# Patient Record
Sex: Male | Born: 2000
Health system: Southern US, Community
[De-identification: ages and names within clinical notes are randomized; demographics above are authoritative.]

## PROBLEM LIST (undated history)

## (undated) ENCOUNTER — Ambulatory Visit

## (undated) DIAGNOSIS — H5500 Unspecified nystagmus: Secondary | ICD-10-CM

## (undated) HISTORY — PX: EYE SURGERY: SHX253

---
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
## 2020-09-06 ENCOUNTER — Other Ambulatory Visit: Payer: Self-pay

## 2020-09-06 ENCOUNTER — Ambulatory Visit
Admission: EM | Admit: 2020-09-06 | Discharge: 2020-09-06 | Disposition: A | Discharge status: Home / Self Care | Payer: Medicaid Other | Attending: Emergency Medicine | Admitting: Emergency Medicine

## 2020-09-06 ENCOUNTER — Ambulatory Visit (INDEPENDENT_AMBULATORY_CARE_PROVIDER_SITE_OTHER): Payer: Medicaid Other

## 2020-09-06 ENCOUNTER — Ambulatory Visit
Admission: RE | Admit: 2020-09-06 | Discharge: 2020-09-06 | Discharge status: Absent without leave | Payer: Self-pay | Source: Ambulatory Visit

## 2020-09-06 DIAGNOSIS — D369 Benign neoplasm, unspecified site: Secondary | ICD-10-CM

## 2020-09-06 DIAGNOSIS — R222 Localized swelling, mass and lump, trunk: Secondary | ICD-10-CM

## 2020-09-06 DIAGNOSIS — L03311 Cellulitis of abdominal wall: Secondary | ICD-10-CM | POA: Diagnosis not present

## 2020-09-06 LAB — CBC WITH DIFFERENTIAL/PLATELET
Abs Immature Granulocytes: 0.01 10*3/uL (ref 0.00–0.07)
Basophils Absolute: 0 10*3/uL (ref 0.0–0.1)
Basophils Relative: 1 %
Eosinophils Absolute: 0.1 10*3/uL (ref 0.0–0.5)
Eosinophils Relative: 1 %
HCT: 42.1 % (ref 39.0–52.0)
Hemoglobin: 14.5 g/dL (ref 13.0–17.0)
Immature Granulocytes: 0 %
Lymphocytes Relative: 41 %
Lymphs Abs: 2.1 10*3/uL (ref 0.7–4.0)
MCH: 31.9 pg (ref 26.0–34.0)
MCHC: 34.4 g/dL (ref 30.0–36.0)
MCV: 92.5 fL (ref 80.0–100.0)
Monocytes Absolute: 0.8 10*3/uL (ref 0.1–1.0)
Monocytes Relative: 16 %
Neutro Abs: 2.1 10*3/uL (ref 1.7–7.7)
Neutrophils Relative %: 41 %
Platelets: 260 10*3/uL (ref 150–400)
RBC: 4.55 MIL/uL (ref 4.22–5.81)
RDW: 12.4 % (ref 11.5–15.5)
WBC: 5.1 10*3/uL (ref 4.0–10.5)
nRBC: 0 % (ref 0.0–0.2)

## 2020-09-06 MED ORDER — MUPIROCIN CALCIUM 2 % NA OINT: TOPICAL_OINTMENT | NASAL | 0 refills | Status: AC

## 2020-09-06 MED ORDER — SULFAMETHOXAZOLE-TRIMETHOPRIM 800-160 MG PO TABS: 1.0000 | ORAL_TABLET | Freq: Two times a day (BID) | ORAL | 0 refills | Status: AC

## 2020-09-06 NOTE — ED Provider Notes (Signed)
[facility]    CSN: [id number] Arrival date & time: 09/06/20  1459      History   Chief Complaint Chief Complaint  Patient presents with   Groin Swelling   Appointment    HPI Adam Kelly is a 19 y.o. male.   19 yo male here for 2 reasons. The first is a knot in his chest at the bottom of hs sternum when his ribs meet his sternum. He reports that he first noticed the knot 3 months ago and it was BB size. It has since grown and is the size of a kidney bean. He denies trauma, the area will be tender from time to time, it ha snot become red, come to a head, or drained anything.  The second issue is swollen lymph nodes in his groin. He reports that he first noticed them on the right side 1.5 months ago. In the last couple of days he has noticed them on the left side. HE denies testicle swelling, pain, pain with urination, urgency, frequency, penile discharge, fever, rash, bruising, night sweats, or weight loss.      Past Medical History:  Diagnosis Date   Nystagmus     There are no problems to display for this patient.   Past Surgical History:  Procedure Laterality Date   EYE SURGERY     EYE SURGERY         Home Medications    Prior to Admission medications   Medication Sig Start Date End Date Taking? Authorizing Provider  mupirocin nasal ointment (BACTROBAN) 2 % Apply in each nostril daily and apply to wound 3 times a day. 09/06/20   Margarette Canada, NP  sulfamethoxazole-trimethoprim (BACTRIM DS) 800-160 MG tablet Take 1 tablet by mouth 2 (two) times daily for 7 days. 09/06/20 09/13/20  Margarette Canada, NP    Family History History reviewed. No pertinent family history.  Social History Social History   Tobacco Use   Smoking status: Current Every Day Smoker    Packs/day: 0.50    Types: Cigarettes   Smokeless tobacco: Never Used  Vaping Use   Vaping Use: Never used  Substance Use Topics   Alcohol use: No   Drug use: Yes    Types:  Marijuana    Comment: last use 2 weeks ago     Allergies   Beef (bovine) protein, Galactose, Other, and Pork allergy   Review of Systems Review of Systems  Constitutional: Positive for diaphoresis. Negative for chills, fatigue, fever and unexpected weight change.       He had sweating last night  HENT: Negative for congestion, ear pain and sore throat.   Respiratory: Negative for apnea, cough, shortness of breath and wheezing.   Cardiovascular: Negative for chest pain.  Gastrointestinal: Negative for abdominal pain, nausea and vomiting.  Genitourinary: Negative for difficulty urinating, discharge, dysuria, frequency, genital sores, hematuria, penile pain, penile swelling, scrotal swelling, testicular pain and urgency.  Musculoskeletal: Negative for arthralgias, back pain and myalgias.  Neurological: Negative for dizziness, syncope and headaches.  Hematological: Positive for adenopathy. Does not bruise/bleed easily.  Psychiatric/Behavioral: Negative.      Physical Exam Triage Vital Signs ED Triage Vitals  Enc Vitals Group     BP 09/06/20 1530 123/68     Pulse Rate 09/06/20 1530 80     Resp 09/06/20 1530 18     Temp 09/06/20 1530 97.8 F (36.6 C)     Temp Source 09/06/20 1530 Oral  SpO2 09/06/20 1530 100 %     Weight 09/06/20 1529 187 lb (84.8 kg)     Height 09/06/20 1529 6' (1.829 m)     Head Circumference --      Peak Flow --      Pain Score 09/06/20 1529 4     Pain Loc --      Pain Edu? --      Excl. in [place]? --    No data found.  Updated Vital Signs BP 123/68 (BP Location: Right Arm)    Pulse 80    Temp 97.8 F (36.6 C) (Oral)    Resp 18    Ht 6' (1.829 m)    Wt 187 lb (84.8 kg)    SpO2 100%    BMI 25.36 kg/m   Visual Acuity Right Eye Distance:   Left Eye Distance:   Bilateral Distance:    Right Eye Near:   Left Eye Near:    Bilateral Near:     Physical Exam Vitals and nursing note reviewed.  Constitutional:      General: He is not in acute  distress.    Appearance: Normal appearance. He is normal weight. He is not ill-appearing or toxic-appearing.  HENT:     Head: Normocephalic and atraumatic.  Eyes:     General: No scleral icterus.    Extraocular Movements: Extraocular movements intact.     Conjunctiva/sclera: Conjunctivae normal.     Pupils: Pupils are equal, round, and reactive to light.  Cardiovascular:     Rate and Rhythm: Normal rate and regular rhythm.     Pulses: Normal pulses.     Heart sounds: Normal heart sounds. No murmur heard.  No gallop.   Pulmonary:     Effort: Pulmonary effort is normal. No respiratory distress.     Breath sounds: No wheezing, rhonchi or rales.  Chest:     Comments: There is a prominent hard knot over the junction where the right 8th rib attaches to the sternum and just below that is a solid, kidney bean sized hard mass. The mass is freely mobile under the skin, the area is not red, it is tender to palpation.  Musculoskeletal:        General: No deformity or signs of injury. Normal range of motion.     Cervical back: Normal range of motion and neck supple.  Lymphadenopathy:     Cervical: No cervical adenopathy.     Lower Body: Right inguinal adenopathy present. Left inguinal adenopathy present.     Comments: Patient has shotty lymph nodes in both inguinal creases.   Skin:    General: Skin is warm and dry.     Capillary Refill: Capillary refill takes less than 2 seconds.     Findings: Erythema and lesion present.     Comments: Patient has a large number of scars on his shoulders, back, and upper chest. He reports that he had a lot of acne when he played football. When asked he reports that some of them were red, painful, had to be cut open and required antibiotics. He currently has multiple red, scaly areas on his perineum that are in different stages of healing with some scabbed. He has a red raised lesion on his central lower abdomen that is hot, tender, and has a head on it. He reports  that he has squeezed some pus out already.   Neurological:     General: No focal deficit present.     Mental  Status: He is alert and oriented to person, place, and time.  Psychiatric:        Mood and Affect: Mood normal.        Behavior: Behavior normal.        Thought Content: Thought content normal.        Judgment: Judgment normal.      UC Treatments / Results  Labs (all labs ordered are listed, but only abnormal results are displayed) Labs Reviewed  AEROBIC CULTURE (SUPERFICIAL SPECIMEN)  CBC WITH DIFFERENTIAL/PLATELET    EKG   Radiology No results found.  Procedures Procedures (including critical care time)  Medications Ordered in UC Medications - No data to display  Initial Impression / Assessment and Plan / UC Course  I have reviewed the triage vital signs and the nursing notes.  Pertinent labs & imaging results that were available during my care of the patient were reviewed by me and considered in my medical decision making (see chart for details).   Patient has a mass on his chest that is consistent with adjacent to a hard knot at the insertion of the 8th rib to the sternum on the right. Possible dermal inclusion cyst but the firmness is concerning. Will obtain CXR to look for any anommoly.   Shotty lymph nodes in bilateral groin appear reactive to localized soft tissue skin infection.   Will collect culture swab and CBC to look for infection.   CBC negative for infection.  CBC unremarkable.  CXR independently read by me. No solid mass or foreign body noted. No bony abnormalities. Will await radiology interpretation.   Final Clinical Impressions(s) / UC Diagnoses   Final diagnoses:  Cellulitis of abdominal wall  Dermoid cyst     Discharge Instructions     No masses of foreign bodies showed up on x-ray.  I would recommend following up with dermatology for evaluation and removal.  The lymph nodes I think are in response to the skin infections  you have on your lower abdomen.  Apply Mupirocin ointment twice daily and take the Bactrim tablets twice daily for 7 days.  If they persist follow-up with dermatology for these as well.     ED Prescriptions    Medication Sig Dispense Auth. Provider   mupirocin nasal ointment (BACTROBAN) 2 % Apply in each nostril daily and apply to wound 3 times a day. 22 g Margarette Canada, NP   sulfamethoxazole-trimethoprim (BACTRIM DS) 800-160 MG tablet Take 1 tablet by mouth 2 (two) times daily for 7 days. 14 tablet Margarette Canada, NP     PDMP not reviewed this encounter.   Margarette Canada, NP 09/06/20 [phone]

## 2020-09-06 NOTE — Discharge Instructions (Signed)
No masses of foreign bodies showed up on x-ray.  I would recommend following up with dermatology for evaluation and removal.  The lymph nodes I think are in response to the skin infections you have on your lower abdomen.  Apply Mupirocin ointment twice daily and take the Bactrim tablets twice daily for 7 days.  If they persist follow-up with dermatology for these as well.

## 2020-09-06 NOTE — ED Triage Notes (Signed)
Pt with "cyst" under skin on chest x past 3 months and getting bigger. Also reports bilateral groin tenderness and swelling

## 2020-09-09 LAB — AEROBIC CULTURE W GRAM STAIN (SUPERFICIAL SPECIMEN)

## 2020-09-09 LAB — AEROBIC CULTURE  (SUPERFICIAL SPECIMEN)

## 2020-09-14 ENCOUNTER — Encounter: Payer: Self-pay | Admitting: Emergency Medicine

## 2020-09-14 ENCOUNTER — Other Ambulatory Visit: Payer: Self-pay

## 2020-09-14 ENCOUNTER — Ambulatory Visit
Admission: EM | Admit: 2020-09-14 | Discharge: 2020-09-14 | Disposition: A | Discharge status: Home / Self Care | Payer: Medicaid Other | Attending: Family Medicine | Admitting: Family Medicine

## 2020-09-14 ENCOUNTER — Ambulatory Visit: Payer: Self-pay

## 2020-09-14 DIAGNOSIS — G44209 Tension-type headache, unspecified, not intractable: Secondary | ICD-10-CM | POA: Diagnosis not present

## 2020-09-14 DIAGNOSIS — M25561 Pain in right knee: Secondary | ICD-10-CM | POA: Diagnosis not present

## 2020-09-14 DIAGNOSIS — M25562 Pain in left knee: Secondary | ICD-10-CM | POA: Diagnosis not present

## 2020-09-14 MED ORDER — KETOROLAC TROMETHAMINE 10 MG PO TABS: 10.0000 mg | ORAL_TABLET | Freq: Four times a day (QID) | ORAL | 0 refills | Status: AC | PRN

## 2020-09-14 NOTE — ED Provider Notes (Signed)
[facility]    CSN: [id number] Arrival date & time: 09/14/20  1247      History   Chief Complaint Chief Complaint  Patient presents with  . Headache  . Knee Pain   HPI  19 year old male presents with the above complaints.  Patient reports that he has had a headache since yesterday.  Located predominantly in the occipital region.  He reports mild photophobia.  No fever.  Pain 5/10 in severity.  He has not been able to get this to resolve with over-the-counter Advil and Tylenol.  Patient also reports ongoing bilateral knee pain.  He describes it as an aching and tingling sensation.  Typically only happens when he is lying down or resting.  Improves with activity.  Denies trauma, fall, injury.  No relief with over-the-counter medication.  No other associated symptoms.  Past Medical History:  Diagnosis Date  . Nystagmus    Past Surgical History:  Procedure Laterality Date  . EYE SURGERY    . EYE SURGERY      Home Medications    Prior to Admission medications   Medication Sig Start Date End Date Taking? Authorizing Provider  ketorolac (TORADOL) 10 MG tablet Take 1 tablet (10 mg total) by mouth every 6 (six) hours as needed for moderate pain or severe pain. 09/14/20   Coral Spikes, DO  mupirocin nasal ointment (BACTROBAN) 2 % Apply in each nostril daily and apply to wound 3 times a day. 09/06/20   Margarette Canada, NP    Family History History reviewed. No pertinent family history.  Social History Social History   Tobacco Use  . Smoking status: Current Every Day Smoker    Packs/day: 0.50    Types: Cigarettes  . Smokeless tobacco: Never Used  Vaping Use  . Vaping Use: Never used  Substance Use Topics  . Alcohol use: No  . Drug use: Yes    Types: Marijuana    Comment: last use 2 weeks ago     Allergies   Beef (bovine) protein, Galactose, Other, and Pork allergy   Review of Systems Review of Systems  Musculoskeletal:       Knee pain.    Neurological: Positive for headaches.   Physical Exam Triage Vital Signs ED Triage Vitals  Enc Vitals Group     BP 09/14/20 1305 126/69     Pulse Rate 09/14/20 1305 96     Resp 09/14/20 1305 16     Temp 09/14/20 1305 98.4 F (36.9 C)     Temp Source 09/14/20 1305 Oral     SpO2 09/14/20 1305 100 %     Weight 09/14/20 1302 180 lb (81.6 kg)     Height 09/14/20 1302 6' (1.829 m)     Head Circumference --      Peak Flow --      Pain Score 09/14/20 1302 5     Pain Loc --      Pain Edu? --      Excl. in [place]? --    Updated Vital Signs BP 126/69 (BP Location: Right Arm)   Pulse 96   Temp 98.4 F (36.9 C) (Oral)   Resp 16   Ht 6' (1.829 m)   Wt 81.6 kg   SpO2 100%   BMI 24.41 kg/m   Visual Acuity Right Eye Distance:   Left Eye Distance:   Bilateral Distance:    Right Eye Near:   Left Eye Near:    Bilateral Near:  Physical Exam Vitals and nursing note reviewed.  Constitutional:      General: He is not in acute distress.    Appearance: Normal appearance. He is not ill-appearing.  HENT:     Head: Normocephalic and atraumatic.  Eyes:     General:        Right eye: No discharge.        Left eye: No discharge.     Conjunctiva/sclera: Conjunctivae normal.  Cardiovascular:     Rate and Rhythm: Normal rate and regular rhythm.     Heart sounds: No murmur heard.   Pulmonary:     Effort: Pulmonary effort is normal.     Breath sounds: Normal breath sounds. No wheezing, rhonchi or rales.  Musculoskeletal:     Comments: R & L knees -no effusion.  No discrete or tenderness.  Ligaments intact.  Neurological:     Mental Status: He is alert.  Psychiatric:        Mood and Affect: Mood normal.        Behavior: Behavior normal.    UC Treatments / Results  Labs (all labs ordered are listed, but only abnormal results are displayed) Labs Reviewed - No data to display  EKG   Radiology No results found.  Procedures Procedures (including critical care  time)  Medications Ordered in UC Medications - No data to display  Initial Impression / Assessment and Plan / UC Course  I have reviewed the triage vital signs and the nursing notes.  Pertinent labs & imaging results that were available during my care of the patient were reviewed by me and considered in my medical decision making (see chart for details).    19 year old male presents with tension headache and bilateral knee pain.  Exam unremarkable.  Toradol as needed.  Supportive care.  Final Clinical Impressions(s) / UC Diagnoses   Final diagnoses:  Tension headache  Acute pain of both knees     Discharge Instructions     Rest.  Medication as prescribed.  Take care  Dr. Lacinda Axon    ED Prescriptions    Medication Sig Dispense Auth. Provider   ketorolac (TORADOL) 10 MG tablet Take 1 tablet (10 mg total) by mouth every 6 (six) hours as needed for moderate pain or severe pain. 20 tablet Coral Spikes, DO     PDMP not reviewed this encounter.   Coral Spikes, [place] 09/14/20 1639

## 2020-09-14 NOTE — ED Triage Notes (Signed)
Patient c/o ongoing bilateral knee pain that has been going on for a week.  Patient describes that pain as tingling and keeping him up at night.  Patient also reports headache that started yesterday.  Patient reports sensitivity to light and sound.  Patient denies fevers.

## 2020-09-14 NOTE — Discharge Instructions (Signed)
Rest  Medication as prescribed.  Take care  Dr. Ladona Rosten  

## 2020-09-22 ENCOUNTER — Emergency Department: Payer: No Typology Code available for payment source

## 2020-09-22 ENCOUNTER — Encounter: Payer: Self-pay | Admitting: *Deleted

## 2020-09-22 ENCOUNTER — Other Ambulatory Visit: Payer: Self-pay

## 2020-09-22 ENCOUNTER — Emergency Department
Admission: EM | Admit: 2020-09-22 | Discharge: 2020-09-22 | Disposition: A | Discharge status: Home / Self Care | Payer: No Typology Code available for payment source | Attending: Emergency Medicine | Admitting: Emergency Medicine

## 2020-09-22 DIAGNOSIS — F17210 Nicotine dependence, cigarettes, uncomplicated: Secondary | ICD-10-CM | POA: Diagnosis not present

## 2020-09-22 DIAGNOSIS — Y92410 Unspecified street and highway as the place of occurrence of the external cause: Secondary | ICD-10-CM | POA: Insufficient documentation

## 2020-09-22 DIAGNOSIS — S8392XA Sprain of unspecified site of left knee, initial encounter: Secondary | ICD-10-CM | POA: Diagnosis not present

## 2020-09-22 DIAGNOSIS — S63502A Unspecified sprain of left wrist, initial encounter: Secondary | ICD-10-CM | POA: Insufficient documentation

## 2020-09-22 DIAGNOSIS — S8992XA Unspecified injury of left lower leg, initial encounter: Secondary | ICD-10-CM | POA: Diagnosis present

## 2020-09-22 MED ORDER — MELOXICAM 15 MG PO TABS: 15.0000 mg | ORAL_TABLET | Freq: Every day | ORAL | 0 refills | Status: AC

## 2020-09-22 MED ORDER — METHOCARBAMOL 500 MG PO TABS
750.0000 mg | ORAL_TABLET | Freq: Once | ORAL | Status: AC
  Administered 2020-09-22: 750 mg via ORAL
  Filled 2020-09-22: qty 2

## 2020-09-22 MED ORDER — IBUPROFEN 600 MG PO TABS
600.0000 mg | ORAL_TABLET | Freq: Once | ORAL | Status: AC
  Administered 2020-09-22: 600 mg via ORAL
  Filled 2020-09-22: qty 1

## 2020-09-22 MED ORDER — METHOCARBAMOL 750 MG PO TABS: 750.0000 mg | ORAL_TABLET | Freq: Four times a day (QID) | ORAL | 0 refills | Status: AC | PRN
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
## 2021-01-04 ENCOUNTER — Encounter: Payer: Self-pay | Admitting: *Deleted

## 2021-04-19 ENCOUNTER — Ambulatory Visit: Payer: Self-pay

## 2021-06-24 DEATH — deceased
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
